# Patient Record
Sex: Female | Born: 1985 | Race: Black or African American | Hispanic: No | Marital: Single | Smoking: Never smoker
Health system: Southern US, Community
[De-identification: ages and names within clinical notes are randomized; demographics above are authoritative.]

## PROBLEM LIST (undated history)

## (undated) HISTORY — PX: ABDOMINAL HYSTERECTOMY: SHX81

---

## 2020-07-22 ENCOUNTER — Other Ambulatory Visit: Payer: Self-pay

## 2020-07-22 ENCOUNTER — Emergency Department (HOSPITAL_COMMUNITY): Payer: PRIVATE HEALTH INSURANCE

## 2020-07-22 ENCOUNTER — Emergency Department (HOSPITAL_COMMUNITY)
Admission: EM | Admit: 2020-07-22 | Discharge: 2020-07-22 | Disposition: A | Discharge status: Home / Self Care | Payer: PRIVATE HEALTH INSURANCE | Attending: Emergency Medicine | Admitting: Emergency Medicine

## 2020-07-22 ENCOUNTER — Encounter (HOSPITAL_COMMUNITY): Payer: Self-pay | Admitting: Emergency Medicine

## 2020-07-22 DIAGNOSIS — M545 Low back pain, unspecified: Secondary | ICD-10-CM | POA: Diagnosis not present

## 2020-07-22 DIAGNOSIS — M542 Cervicalgia: Secondary | ICD-10-CM | POA: Diagnosis not present

## 2020-07-22 DIAGNOSIS — M25511 Pain in right shoulder: Secondary | ICD-10-CM

## 2020-07-22 DIAGNOSIS — R519 Headache, unspecified: Secondary | ICD-10-CM | POA: Diagnosis not present

## 2020-07-22 DIAGNOSIS — Y92410 Unspecified street and highway as the place of occurrence of the external cause: Secondary | ICD-10-CM | POA: Diagnosis not present

## 2020-07-22 DIAGNOSIS — R202 Paresthesia of skin: Secondary | ICD-10-CM | POA: Diagnosis not present

## 2020-07-22 MED ORDER — IBUPROFEN 600 MG PO TABS: 600.0000 mg | ORAL_TABLET | Freq: Four times a day (QID) | ORAL | 0 refills | Status: AC | PRN

## 2020-07-22 MED ORDER — LIDOCAINE 5 % EX PTCH
1.0000 | MEDICATED_PATCH | Freq: Once | CUTANEOUS | Status: DC
  Administered 2020-07-22: 1 via TRANSDERMAL
  Filled 2020-07-22: qty 1

## 2020-07-22 MED ORDER — KETOROLAC TROMETHAMINE 30 MG/ML IJ SOLN
30.0000 mg | Freq: Once | INTRAMUSCULAR | Status: AC
  Administered 2020-07-22: 30 mg via INTRAMUSCULAR
  Filled 2020-07-22: qty 1

## 2020-07-22 MED ORDER — METHOCARBAMOL 500 MG PO TABS: 500.0000 mg | ORAL_TABLET | Freq: Two times a day (BID) | ORAL | 0 refills | Status: AC
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
## 2020-07-22 NOTE — ED Notes (Signed)
Patient transported to X-ray 
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
## 2020-07-22 NOTE — Discharge Instructions (Addendum)

## 2022-02-28 ENCOUNTER — Ambulatory Visit
Admission: RE | Admit: 2022-02-28 | Discharge: 2022-02-28 | Disposition: A | Discharge status: Home / Self Care | Payer: BC Managed Care – PPO | Source: Ambulatory Visit | Attending: Nurse Practitioner | Admitting: Nurse Practitioner

## 2022-02-28 VITALS — BP 149/89 | HR 88 | Temp 98.3°F | Resp 20

## 2022-02-28 DIAGNOSIS — R350 Frequency of micturition: Secondary | ICD-10-CM | POA: Diagnosis present

## 2022-02-28 LAB — POCT URINALYSIS DIP (MANUAL ENTRY)
Bilirubin, UA: NEGATIVE
Blood, UA: NEGATIVE
Glucose, UA: NEGATIVE mg/dL
Ketones, POC UA: NEGATIVE mg/dL
Leukocytes, UA: NEGATIVE
Nitrite, UA: NEGATIVE
Protein Ur, POC: 30 mg/dL — AB
Spec Grav, UA: >=1.03 — AB (ref 1.010–1.025)
Urobilinogen, UA: 0.2 E.U./dL
pH, UA: 6 (ref 5.0–8.0)

## 2022-02-28 LAB — POCT URINE PREGNANCY: Preg Test, Ur: NEGATIVE

## 2022-02-28 MED ORDER — PHENAZOPYRIDINE HCL 200 MG PO TABS: 200.0000 mg | ORAL_TABLET | Freq: Three times a day (TID) | ORAL | 0 refills | Status: AC

## 2022-02-28 NOTE — ED Triage Notes (Signed)
Pt c/o urinary frequency, and lower abd pain that began about a week and a half ago.  Home interventions: tylenol

## 2022-02-28 NOTE — ED Provider Notes (Signed)
[facility]    CSN: [id number] Arrival date & time: 02/28/22  1015      History   Chief Complaint Chief Complaint  Patient presents with   Urinary Frequency   Abdominal Pain    HPI Rose Rosales is a 37 y.o. female  who presents for evaluation of urinary frequency for 1-1/2 weeks.  Patient endorses urinary frequency for a week and a half.  Few days ago she developed some abdominal cramping at the end of the urine stream only.  She denies urinary burning, urgency, hematuria, malodorous urine.  No vaginal discharge or STD concern.  No abdominal bloating.  States last bowel movement was this morning and was normal for her.  Denies any nausea/vomiting, fevers, chills, flank pain.  No history of recurrent UTIs.  She is drinking more water recently and changing her diet.  She is eating and drinking normally.  She has not taken any OTC medications.  No other concerns at this time.      Urinary Frequency Associated symptoms include abdominal pain.  Abdominal Pain   History reviewed. No pertinent past medical history.  There are no problems to display for this patient.   Past Surgical History:  Procedure Laterality Date   ABDOMINAL HYSTERECTOMY      OB History   No obstetric history on file.      Home Medications    Prior to Admission medications   Medication Sig Start Date End Date Taking? Authorizing Provider  phenazopyridine (PYRIDIUM) 200 MG tablet Take 1 tablet (200 mg total) by mouth 3 (three) times daily. 02/28/22  Yes Melynda Ripple, NP  ibuprofen (ADVIL) 600 MG tablet Take 1 tablet (600 mg total) by mouth every 6 (six) hours as needed. 07/22/20   Jacqlyn Larsen, PA-C  methocarbamol (ROBAXIN) 500 MG tablet Take 1 tablet (500 mg total) by mouth 2 (two) times daily. 07/22/20   Jacqlyn Larsen, PA-C    Family History History reviewed. No pertinent family history.  Social History Social History   Tobacco Use   Smoking status: Never   Smokeless tobacco:  Never  Substance Use Topics   Alcohol use: Not Currently   Drug use: Never     Allergies   Penicillins   Review of Systems Review of Systems  Gastrointestinal:  Positive for abdominal pain.  Genitourinary:  Positive for frequency.     Physical Exam Triage Vital Signs ED Triage Vitals  Enc Vitals Group     BP 02/28/22 1038 (!) 149/89     Pulse Rate 02/28/22 1038 88     Resp 02/28/22 1038 20     Temp 02/28/22 1038 98.3 F (36.8 C)     Temp Source 02/28/22 1038 Oral     SpO2 02/28/22 1038 97 %     Weight --      Height --      Head Circumference --      Peak Flow --      Pain Score 02/28/22 1037 4     Pain Loc --      Pain Edu? --      Excl. in [place]? --    No data found.  Updated Vital Signs BP (!) 149/89 (BP Location: Right Arm)   Pulse 88   Temp 98.3 F (36.8 C) (Oral)   Resp 20   SpO2 97%   Visual Acuity Right Eye Distance:   Left Eye Distance:   Bilateral Distance:    Right Eye Near:  Left Eye Near:    Bilateral Near:     Physical Exam Vitals and nursing note reviewed.  Constitutional:      Appearance: Normal appearance.  HENT:     Head: Normocephalic and atraumatic.  Eyes:     Pupils: Pupils are equal, round, and reactive to light.  Cardiovascular:     Rate and Rhythm: Normal rate.  Pulmonary:     Effort: Pulmonary effort is normal.  Abdominal:     General: Bowel sounds are normal.     Palpations: Abdomen is soft.     Tenderness: There is no abdominal tenderness. There is no right CVA tenderness or left CVA tenderness. Negative signs include Murphy's sign.  Skin:    General: Skin is warm and dry.  Neurological:     General: No focal deficit present.     Mental Status: She is alert and oriented to person, place, and time.  Psychiatric:        Mood and Affect: Mood normal.        Behavior: Behavior normal.      UC Treatments / Results  Labs (all labs ordered are listed, but only abnormal results are displayed) Labs Reviewed  POCT  URINALYSIS DIP (MANUAL ENTRY) - Abnormal; Notable for the following components:      Result Value   Spec Grav, UA >=1.030 (*)    Protein Ur, POC =30 (*)    All other components within normal limits  URINE CULTURE  POCT URINE PREGNANCY    EKG   Radiology No results found.  Procedures Procedures (including critical care time)  Medications Ordered in UC Medications - No data to display  Initial Impression / Assessment and Plan / UC Course  I have reviewed the triage vital signs and the nursing notes.  Pertinent labs & imaging results that were available during my care of the patient were reviewed by me and considered in my medical decision making (see chart for details).     Reviewed exam and symptoms with patient.  No red flags on exam. Will start Pyridium for frequency and send urine culture.  No sign of UTI and UA today Abdominal exam unremarkable Continue fluids Follow-up with PCP in 2 days for recheck ER precautions reviewed and patient verbalized understanding Final Clinical Impressions(s) / UC Diagnoses   Final diagnoses:  Urinary frequency     Discharge Instructions      I will send her urine for culture and contact you with the results are positive for infection Start Pyridium as needed for urinary frequency.  This can make your urine orange Avoid carbonated drinks Follow-up with your PCP in 2 to 3 days for recheck Please go to the emergency room if you have any worsening symptoms   ED Prescriptions     Medication Sig Dispense Auth. Provider   phenazopyridine (PYRIDIUM) 200 MG tablet Take 1 tablet (200 mg total) by mouth 3 (three) times daily. 6 tablet Melynda Ripple, NP      PDMP not reviewed this encounter.   Melynda Ripple, NP 02/28/22 1102

## 2022-02-28 NOTE — Discharge Instructions (Signed)
I will send her urine for culture and contact you with the results are positive for infection Start Pyridium as needed for urinary frequency.  This can make your urine orange Avoid carbonated drinks Follow-up with your PCP in 2 to 3 days for recheck Please go to the emergency room if you have any worsening symptoms

## 2022-03-01 LAB — URINE CULTURE: Culture: NO GROWTH
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
